# Patient Record
Sex: Female | Born: 2005 | Race: White | Hispanic: No | Marital: Single | Smoking: Never smoker
Health system: Southern US, Community
[De-identification: ages and names within clinical notes are randomized; demographics above are authoritative.]

---
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
## 2024-06-29 ENCOUNTER — Encounter (HOSPITAL_COMMUNITY): Payer: Self-pay | Admitting: Emergency Medicine

## 2024-06-29 ENCOUNTER — Emergency Department (HOSPITAL_COMMUNITY)
Admission: EM | Admit: 2024-06-29 | Discharge: 2024-06-30 | Disposition: A | Discharge status: Home / Self Care | Payer: Self-pay | Attending: Emergency Medicine | Admitting: Emergency Medicine

## 2024-06-29 ENCOUNTER — Other Ambulatory Visit: Payer: Self-pay

## 2024-06-29 DIAGNOSIS — O209 Hemorrhage in early pregnancy, unspecified: Secondary | ICD-10-CM | POA: Insufficient documentation

## 2024-06-29 DIAGNOSIS — R000 Tachycardia, unspecified: Secondary | ICD-10-CM | POA: Insufficient documentation

## 2024-06-29 DIAGNOSIS — Z3A01 Less than 8 weeks gestation of pregnancy: Secondary | ICD-10-CM | POA: Insufficient documentation

## 2024-06-29 DIAGNOSIS — O99891 Other specified diseases and conditions complicating pregnancy: Secondary | ICD-10-CM | POA: Insufficient documentation

## 2024-06-29 DIAGNOSIS — O4690 Antepartum hemorrhage, unspecified, unspecified trimester: Secondary | ICD-10-CM

## 2024-06-29 DIAGNOSIS — O200 Threatened abortion: Secondary | ICD-10-CM

## 2024-06-29 LAB — CBC WITH DIFFERENTIAL/PLATELET
Abs Immature Granulocytes: 0.03 K/uL (ref 0.00–0.07)
Basophils Absolute: 0 K/uL (ref 0.0–0.1)
Basophils Relative: 1 %
Eosinophils Absolute: 0 K/uL (ref 0.0–0.5)
Eosinophils Relative: 1 %
HCT: 36.9 % (ref 36.0–46.0)
Hemoglobin: 12.3 g/dL (ref 12.0–15.0)
Immature Granulocytes: 0 %
Lymphocytes Relative: 20 %
Lymphs Abs: 1.6 K/uL (ref 0.7–4.0)
MCH: 28.1 pg (ref 26.0–34.0)
MCHC: 33.3 g/dL (ref 30.0–36.0)
MCV: 84.4 fL (ref 80.0–100.0)
Monocytes Absolute: 0.8 K/uL (ref 0.1–1.0)
Monocytes Relative: 10 %
Neutro Abs: 5.5 K/uL (ref 1.7–7.7)
Neutrophils Relative %: 68 %
Platelets: 327 K/uL (ref 150–400)
RBC: 4.37 MIL/uL (ref 3.87–5.11)
RDW: 12.5 % (ref 11.5–15.5)
WBC: 8 K/uL (ref 4.0–10.5)
nRBC: 0 % (ref 0.0–0.2)

## 2024-06-29 LAB — HCG, QUANTITATIVE, PREGNANCY: hCG, Beta Chain, Quant, S: 43251 m[IU]/mL — ABNORMAL HIGH (ref <5)

## 2024-06-29 NOTE — ED Triage Notes (Signed)
 Pt here with c/o vaginal bleeding. Pt states that she had been having abdominal cramping during the day and that she took a bath and started bleeding. Pt is [redacted] weeks pregnant. States she went to bathroom prior to triage and that it was a lot of blood. Pt is very tearful in triage.

## 2024-06-29 NOTE — ED Provider Notes (Signed)
 [place] EMERGENCY DEPARTMENT AT [facility] Provider Note   CSN: [id number] Arrival date & time: 06/29/24  2044     Patient presents with: Vaginal Bleeding   Stephanie Day is a 18 y.o. female.    Vaginal Bleeding Associated symptoms: abdominal pain         Stephanie Day is a 18 y.o. female G1 P0 [redacted] weeks pregnant by LMP.  Had confirmation with urine pregnancy at [facility].  No previous prenatal care.  she presents to the Emergency Department complaining of onset of lower abdominal/pelvic pain around 7 PM tonight.  Pain was gradually worsening, noticed vaginal bleeding approximately 1 hour later.  Came to the emergency room and upon arrival here, stated that the bleeding was very heavy.  Pain is somewhat improving.    Prior to Admission medications  Not on File    Allergies: Patient has no known allergies.    Review of Systems  Gastrointestinal:  Positive for abdominal pain.  Genitourinary:  Positive for pelvic pain and vaginal bleeding.  All other systems reviewed and are negative.   Updated Vital Signs BP 124/79 (BP Location: Right Arm)   Pulse (!) 118   Temp 99.4 F (37.4 C) (Oral)   Resp 20   Ht 5' 3 (1.6 m)   Wt 61.2 kg   SpO2 99%   BMI 23.91 kg/m   Physical Exam Vitals and nursing note reviewed. Exam conducted with a chaperone present.  Constitutional:      General: She is not in acute distress.    Appearance: She is not toxic-appearing.     Comments: Pt is tearful on exam  HENT:     Mouth/Throat:     Mouth: Mucous membranes are moist.  Cardiovascular:     Rate and Rhythm: Regular rhythm. Tachycardia present.     Pulses: Normal pulses.  Pulmonary:     Effort: Pulmonary effort is normal.  Abdominal:     Palpations: Abdomen is soft.     Tenderness: There is abdominal tenderness in the left lower quadrant.     Comments: Mild tenderness to palpation of LLQ.  No guarding or rebound tenderness.  Abdomen is soft.     Genitourinary:    Comments: Speculum exam performed by me.  Moderate amount of blood in the vaginal vault.  Cervix partially visualized, but os does not appear dilated.  Bimanual exam deferred due to patient's level of discomfort Musculoskeletal:     Right lower leg: No edema.     Left lower leg: No edema.  Skin:    General: Skin is warm.     Capillary Refill: Capillary refill takes less than 2 seconds.  Neurological:     General: No focal deficit present.     Mental Status: She is alert.     Sensory: No sensory deficit.     Motor: No weakness.     (all labs ordered are listed, but only abnormal results are displayed) Labs Reviewed  HCG, QUANTITATIVE, PREGNANCY - Abnormal; Notable for the following components:      Result Value   hCG, Beta Chain, Quant, S 43,251 (*)    All other components within normal limits  CBC WITH DIFFERENTIAL/PLATELET  URINALYSIS, ROUTINE W REFLEX MICROSCOPIC  POC URINE PREG, ED    EKG: None  Radiology: No results found.   Procedures   Medications Ordered in the ED - No data to display  Medical Decision Making   Patient here from home, [redacted] weeks pregnant by LMP G1, P0 began having lower abdominal pelvic pain around 7 PM this evening.  Approximately 1 hour later began having heavy vaginal bleeding.  Pelvic pain has somewhat subsided.  No fever vomiting or dysuria.  No previous prenatal care.  No no history of ectopics or family history of ectopic pregnancies  Probable miscarriage, but ectopic, intrauterine pregnancy also considered  Amount and/or Complexity of Data Reviewed Labs: ordered.    Details: Hemoglobin reassuring, no leukocytosis,  Quantitative hCG greater than 43,000 Radiology: ordered.    Details: Transvaginal and OB ultrasound less than 14 weeks ordered, results pending Discussion of management or test interpretation with external provider(s):   US  called to return here for US  to rule out  ectopic.  Discussed with overnight ED provider, Dr. Geroldine who assumes care and will arrange proper dispo        Final diagnoses:  Vaginal bleeding in pregnancy    ED Discharge Orders     None          Herlinda Milling, PA-C 06/29/24 2359    Geroldine Berg, MD 06/30/24 [phone]

## 2024-06-30 ENCOUNTER — Emergency Department (HOSPITAL_COMMUNITY): Payer: Self-pay

## 2024-06-30 DIAGNOSIS — O3680X0 Pregnancy with inconclusive fetal viability, not applicable or unspecified: Secondary | ICD-10-CM | POA: Diagnosis not present

## 2024-06-30 DIAGNOSIS — Z3A00 Weeks of gestation of pregnancy not specified: Secondary | ICD-10-CM | POA: Diagnosis not present

## 2024-06-30 LAB — URINALYSIS, ROUTINE W REFLEX MICROSCOPIC
Bacteria, UA: NONE SEEN
Bilirubin Urine: NEGATIVE
Glucose, UA: NEGATIVE mg/dL
Ketones, ur: 20 mg/dL — AB
Leukocytes,Ua: NEGATIVE
Nitrite: NEGATIVE
Protein, ur: NEGATIVE mg/dL
RBC / HPF: >50 RBC/hpf (ref 0–5)
Specific Gravity, Urine: 1.019 (ref 1.005–1.030)
pH: 5 (ref 5.0–8.0)

## 2024-06-30 NOTE — Discharge Instructions (Signed)
 Follow-up with your obstetrician tomorrow to arrange a follow-up appointment.  I am recommending a repeat quantitative hCG in 48 to 72 hours.  Return to the ER if you develop severe abdominal pain, worsening bleeding, high fevers, or for other new and concerning symptoms.

## 2024-06-30 NOTE — Progress Notes (Signed)
 Patient ID: Stephanie Day, female   DOB: 2005-12-06, 18 y.o.   MRN: [id number]   OB/GYN Telephone Consult  06/30/2024   Stephanie Day is a 62 y.o. G1P0 who is currently pregnant presenting to [facility] .   I was called for a consult regarding the care of this patient by the Physician (MD/DO) caring for the patient.   The provider had the following clinical question: HCG is 44,000 U/s does not show IUP Had heavy bleeding U/s does not show free fluid  The provider presented the following relevant clinical information: Stable Hgb  I performed a chart review on the patient and reviewed available documentation.  BP 124/79 (BP Location: Right Arm)   Pulse (!) 118   Temp 99.4 F (37.4 C) (Oral)   Resp 20   Ht 5' 3 (1.6 m)   Wt 61.2 kg   SpO2 99%   BMI 23.91 kg/m   Exam- performed by consulting provider   Recommendations:  -f/u HCG in 48 hours -Ectopic precautions -Recommended pt for follow up in  2 days @ [facility].   Thank you for this consult and if additional recommendations are needed please call [phone] for the OB/GYN attending on service at [facility].   I spent approximately 10 minutes directly consulting with the provider and verbally discussing this case. Additionally 10 minutes minutes was spent performing chart review and documentation.    Stephanie GORMAN Birk, MD

## 2024-06-30 NOTE — ED Notes (Signed)
 Patient transported to Ultrasound with this RN as biomedical engineer.

## 2024-07-02 ENCOUNTER — Other Ambulatory Visit (HOSPITAL_COMMUNITY): Payer: Self-pay | Attending: Pediatrics
